# Patient Record
Sex: Male | Born: 1935 | Race: White | Hispanic: No | Smoking: Never smoker
Health system: Southern US, Community
[De-identification: ages and names within clinical notes are randomized; demographics above are authoritative.]

## PROBLEM LIST (undated history)

## (undated) DIAGNOSIS — Z8673 Personal history of transient ischemic attack (TIA), and cerebral infarction without residual deficits: Secondary | ICD-10-CM

## (undated) DIAGNOSIS — I4891 Unspecified atrial fibrillation: Secondary | ICD-10-CM

## (undated) DIAGNOSIS — I2510 Atherosclerotic heart disease of native coronary artery without angina pectoris: Secondary | ICD-10-CM

## (undated) DIAGNOSIS — I10 Essential (primary) hypertension: Secondary | ICD-10-CM

---

## 2013-01-09 ENCOUNTER — Encounter (HOSPITAL_BASED_OUTPATIENT_CLINIC_OR_DEPARTMENT_OTHER): Payer: Self-pay | Admitting: Emergency Medicine

## 2013-01-09 ENCOUNTER — Emergency Department (HOSPITAL_BASED_OUTPATIENT_CLINIC_OR_DEPARTMENT_OTHER): Payer: Medicare Other

## 2013-01-09 ENCOUNTER — Observation Stay (HOSPITAL_BASED_OUTPATIENT_CLINIC_OR_DEPARTMENT_OTHER)
Admission: EM | Admit: 2013-01-09 | Discharge: 2013-01-10 | Disposition: A | Discharge status: Home / Self Care | Payer: Medicare Other | Attending: Family Medicine | Admitting: Family Medicine

## 2013-01-09 DIAGNOSIS — R42 Dizziness and giddiness: Secondary | ICD-10-CM | POA: Insufficient documentation

## 2013-01-09 DIAGNOSIS — Z79899 Other long term (current) drug therapy: Secondary | ICD-10-CM | POA: Insufficient documentation

## 2013-01-09 DIAGNOSIS — Z7901 Long term (current) use of anticoagulants: Secondary | ICD-10-CM

## 2013-01-09 DIAGNOSIS — I4891 Unspecified atrial fibrillation: Secondary | ICD-10-CM

## 2013-01-09 DIAGNOSIS — H8143 Vertigo of central origin, bilateral: Secondary | ICD-10-CM

## 2013-01-09 DIAGNOSIS — I498 Other specified cardiac arrhythmias: Secondary | ICD-10-CM | POA: Insufficient documentation

## 2013-01-09 DIAGNOSIS — I493 Ventricular premature depolarization: Secondary | ICD-10-CM

## 2013-01-09 DIAGNOSIS — K148 Other diseases of tongue: Principal | ICD-10-CM | POA: Insufficient documentation

## 2013-01-09 DIAGNOSIS — I10 Essential (primary) hypertension: Secondary | ICD-10-CM

## 2013-01-09 DIAGNOSIS — R58 Hemorrhage, not elsewhere classified: Secondary | ICD-10-CM

## 2013-01-09 DIAGNOSIS — Z8673 Personal history of transient ischemic attack (TIA), and cerebral infarction without residual deficits: Secondary | ICD-10-CM

## 2013-01-09 DIAGNOSIS — D6832 Hemorrhagic disorder due to extrinsic circulating anticoagulants: Secondary | ICD-10-CM

## 2013-01-09 DIAGNOSIS — N179 Acute kidney failure, unspecified: Secondary | ICD-10-CM

## 2013-01-09 DIAGNOSIS — E860 Dehydration: Secondary | ICD-10-CM | POA: Diagnosis present

## 2013-01-09 DIAGNOSIS — K1379 Other lesions of oral mucosa: Secondary | ICD-10-CM | POA: Diagnosis present

## 2013-01-09 DIAGNOSIS — R001 Bradycardia, unspecified: Secondary | ICD-10-CM

## 2013-01-09 HISTORY — DX: Unspecified atrial fibrillation: I48.91

## 2013-01-09 HISTORY — DX: Atherosclerotic heart disease of native coronary artery without angina pectoris: I25.10

## 2013-01-09 HISTORY — DX: Personal history of transient ischemic attack (TIA), and cerebral infarction without residual deficits: Z86.73

## 2013-01-09 HISTORY — DX: Essential (primary) hypertension: I10

## 2013-01-09 LAB — COMPREHENSIVE METABOLIC PANEL
ALT: 18 U/L (ref 0–53)
Alkaline Phosphatase: 95 U/L (ref 39–117)
BUN: 39 mg/dL — ABNORMAL HIGH (ref 6–23)
CO2: 28 mEq/L (ref 19–32)
Creatinine, Ser: 1.5 mg/dL — ABNORMAL HIGH (ref 0.50–1.35)
GFR calc Af Amer: 47 mL/min — ABNORMAL LOW (ref >90)
GFR calc non Af Amer: 41 mL/min — ABNORMAL LOW (ref >90)
Glucose, Bld: 107 mg/dL — ABNORMAL HIGH (ref 70–99)
Potassium: 4.3 mEq/L (ref 3.5–5.1)
Sodium: 137 mEq/L (ref 135–145)
Total Protein: 6.9 g/dL (ref 6.0–8.3)

## 2013-01-09 LAB — DIFFERENTIAL
Basophils Relative: 0 % (ref 0–1)
Eosinophils Absolute: 0.1 10*3/uL (ref 0.0–0.7)
Eosinophils Relative: 1 % (ref 0–5)
Lymphocytes Relative: 10 % — ABNORMAL LOW (ref 12–46)
Lymphs Abs: 0.8 10*3/uL (ref 0.7–4.0)
Monocytes Absolute: 0.5 10*3/uL (ref 0.1–1.0)
Neutro Abs: 6.3 10*3/uL (ref 1.7–7.7)

## 2013-01-09 LAB — CBC
HCT: 42.1 % (ref 39.0–52.0)
Hemoglobin: 14 g/dL (ref 13.0–17.0)
MCH: 30.8 pg (ref 26.0–34.0)
MCHC: 33.3 g/dL (ref 30.0–36.0)
MCV: 92.7 fL (ref 78.0–100.0)
RDW: 14.2 % (ref 11.5–15.5)

## 2013-01-09 LAB — TROPONIN I: Troponin I: <0.3 ng/mL (ref <0.30)

## 2013-01-09 LAB — APTT: aPTT: 46 seconds — ABNORMAL HIGH (ref 24–37)

## 2013-01-09 MED ORDER — "THROMBI-PAD 3""X3"" EX PADS"
1.0000 | MEDICATED_PAD | Freq: Once | CUTANEOUS | Status: AC
  Administered 2013-01-09: 1 via TOPICAL

## 2013-01-09 MED ORDER — ATORVASTATIN CALCIUM 10 MG PO TABS
10.0000 mg | ORAL_TABLET | Freq: Every day | ORAL | Status: DC
  Administered 2013-01-09 – 2013-01-10 (×2): 10 mg via ORAL
  Filled 2013-01-09 (×2): qty 1

## 2013-01-09 MED ORDER — SODIUM CHLORIDE 0.9 % IV BOLUS (SEPSIS)
500.0000 mL | Freq: Once | INTRAVENOUS | Status: AC
  Administered 2013-01-09: 500 mL via INTRAVENOUS

## 2013-01-09 MED ORDER — LIDOCAINE-EPINEPHRINE 2 %-1:100000 IJ SOLN: 1.7000 mL | Freq: Once | INTRAMUSCULAR | Status: DC

## 2013-01-09 MED ORDER — ONDANSETRON HCL 4 MG/2ML IJ SOLN: 4.0000 mg | Freq: Four times a day (QID) | INTRAMUSCULAR | Status: DC | PRN

## 2013-01-09 MED ORDER — "THROMBI-PAD 3""X3"" EX PADS"
MEDICATED_PAD | CUTANEOUS | Status: AC
  Filled 2013-01-09: qty 1

## 2013-01-09 MED ORDER — SODIUM CHLORIDE 0.9 % IJ SOLN
3.0000 mL | Freq: Two times a day (BID) | INTRAMUSCULAR | Status: DC
  Administered 2013-01-09: 3 mL via INTRAVENOUS

## 2013-01-09 MED ORDER — SODIUM CHLORIDE 0.9 % IV SOLN
INTRAVENOUS | Status: DC
  Administered 2013-01-09: 11:00:00 via INTRAVENOUS

## 2013-01-09 MED ORDER — FUROSEMIDE 20 MG PO TABS
20.0000 mg | ORAL_TABLET | Freq: Every day | ORAL | Status: DC
  Administered 2013-01-09 – 2013-01-10 (×2): 20 mg via ORAL
  Filled 2013-01-09 (×2): qty 1

## 2013-01-09 MED ORDER — IRBESARTAN 300 MG PO TABS
300.0000 mg | ORAL_TABLET | Freq: Every day | ORAL | Status: DC
  Administered 2013-01-09 – 2013-01-10 (×2): 300 mg via ORAL
  Filled 2013-01-09 (×2): qty 1

## 2013-01-09 MED ORDER — MORPHINE SULFATE 2 MG/ML IJ SOLN: 1.0000 mg | INTRAMUSCULAR | Status: DC | PRN

## 2013-01-09 MED ORDER — ACETAMINOPHEN 650 MG RE SUPP: 650.0000 mg | Freq: Four times a day (QID) | RECTAL | Status: DC | PRN

## 2013-01-09 MED ORDER — LIDOCAINE-EPINEPHRINE 2 %-1:100000 IJ SOLN
INTRAMUSCULAR | Status: AC
  Filled 2013-01-09: qty 1

## 2013-01-09 MED ORDER — "THROMBI-PAD 3""X3"" EX PADS"
MEDICATED_PAD | CUTANEOUS | Status: AC
  Administered 2013-01-09: 1 via TOPICAL
  Filled 2013-01-09: qty 1

## 2013-01-09 MED ORDER — GELATIN ABSORBABLE 12-7 MM EX MISC
CUTANEOUS | Status: AC
  Filled 2013-01-09: qty 1

## 2013-01-09 MED ORDER — "THROMBI-PAD 3""X3"" EX PADS"
1.0000 | MEDICATED_PAD | Freq: Once | CUTANEOUS | Status: AC
  Administered 2013-01-09: 1 via TOPICAL
  Filled 2013-01-09: qty 1

## 2013-01-09 MED ORDER — ACETAMINOPHEN 325 MG PO TABS
650.0000 mg | ORAL_TABLET | Freq: Four times a day (QID) | ORAL | Status: DC | PRN
  Administered 2013-01-10: 650 mg via ORAL
  Filled 2013-01-09 (×2): qty 2

## 2013-01-09 MED ORDER — ONDANSETRON HCL 4 MG PO TABS: 4.0000 mg | ORAL_TABLET | Freq: Four times a day (QID) | ORAL | Status: DC | PRN

## 2013-01-09 NOTE — ED Provider Notes (Signed)
CSN: [id number]     Arrival date & time 01/09/13  4098 History   First MD Initiated Contact with Patient 01/09/13 [phone]     Chief Complaint  Patient presents with  . Dizziness   (Consider location/radiation/quality/duration/timing/severity/associated sxs/prior Treatment) The history is provided by the patient.  Patient states he has mild dizziness, with spinning sensation without tinnitus but states he is here for a blood transfusion related to the fact that his tongue has been bleeding for 3 days on coumadin and [place] sent him here for that.  He thinks the spinning is related to blood loss.  States he walks with a cane due to previous stroke and denies speech, changes weakness or numbness.  EDP asked about facial droop but he states that is related to the dried blood in the corner of his mouth  Past Medical History  Diagnosis Date  . Hypertension   . Coronary artery disease   . Stroke   . A-fib    History reviewed. No pertinent past surgical history. History reviewed. No pertinent family history. History  Substance Use Topics  . Smoking status: Never Smoker   . Smokeless tobacco: Not on file  . Alcohol Use: No    Review of Systems  Cardiovascular: Negative for chest pain.  Neurological: Positive for dizziness. Negative for syncope, speech difficulty, weakness, numbness and headaches.  All other systems reviewed and are negative.    Allergies  Review of patient's allergies indicates no known allergies.  Home Medications   Current Outpatient Rx  Name  Route  Sig  Dispense  Refill  . atorvastatin (LIPITOR) 10 MG tablet   Oral   Take 10 mg by mouth daily.         . bisoprolol (ZEBETA) 5 MG tablet   Oral   Take 5 mg by mouth daily.         . digoxin (LANOXIN) 0.25 MG tablet   Oral   Take 0.25 mg by mouth daily.         . furosemide (LASIX) 20 MG tablet   Oral   Take 20 mg by mouth.         . valsartan (DIOVAN) 320 MG tablet   Oral   Take 320 mg by  mouth daily.         Marland Kitchen warfarin (COUMADIN) 5 MG tablet   Oral   Take 5 mg by mouth daily.          BP 154/67  Pulse 61  Temp(Src) 98.6 F (37 C) (Oral)  Resp 18  Ht 5\' 8"  (1.727 m)  Wt 200 lb (90.719 kg)  BMI 30.42 kg/m2  SpO2 100% Physical Exam  Constitutional: He is oriented to person, place, and time. He appears well-developed and well-nourished.  HENT:  Head: Normocephalic and atraumatic.  Mouth/Throat: No oropharyngeal exudate.  Flap of tongue with necrotic edge oozing on exam  Eyes: Conjunctivae and EOM are normal. Pupils are equal, round, and reactive to light.  Neck: Normal range of motion. Neck supple.  Cardiovascular: An irregularly irregular rhythm present. Bradycardia present.   Pulmonary/Chest: Effort normal and breath sounds normal. He has no wheezes. He has no rales.  Abdominal: Soft. Bowel sounds are normal. There is no tenderness. There is no rebound and no guarding.  Musculoskeletal: Normal range of motion.  Neurological: He is alert and oriented to person, place, and time. He has normal reflexes. He displays normal reflexes. He exhibits normal muscle tone.  Facial droop noted  Skin: Skin is warm and dry. He is not diaphoretic.  Psychiatric: He has a normal mood and affect.    ED Course  Procedures (including critical care time) Labs Review Labs Reviewed  PROTIME-INR - Abnormal; Notable for the following:    Prothrombin Time 24.7 (*)    INR 2.32 (*)    All other components within normal limits  APTT - Abnormal; Notable for the following:    aPTT 46 (*)    All other components within normal limits  DIFFERENTIAL - Abnormal; Notable for the following:    Neutrophils Relative % 82 (*)    Lymphocytes Relative 10 (*)    All other components within normal limits  COMPREHENSIVE METABOLIC PANEL - Abnormal; Notable for the following:    Glucose, Bld 107 (*)    BUN 39 (*)    Creatinine, Ser 1.50 (*)    Total Bilirubin 1.3 (*)    GFR calc non Af Amer 41  (*)    GFR calc Af Amer 47 (*)    All other components within normal limits  CBC  TROPONIN I   Imaging Review Ct Head (brain) Wo Contrast  01/09/2013   CLINICAL DATA:  Dizziness, hypertension.  EXAM: CT HEAD WITHOUT CONTRAST  TECHNIQUE: Contiguous axial images were obtained from the base of the skull through the vertex without intravenous contrast.  COMPARISON:  None.  FINDINGS: Atherosclerotic and physiologic intracranial calcifications. Lacunar infarcts in the right caudate nucleus and left external capsule. Diffuse parenchymal atrophy. Patchy areas of hypoattenuation in deep and periventricular white matter bilaterally. Negative for acute intracranial hemorrhage, mass lesion, acute infarction, midline shift, or mass-effect. Acute infarct may be in apparent on noncontrast CT. Ventricles and sulci symmetric. Bone windows demonstrate no focal lesion.  IMPRESSION:  Negative for bleed or other acute intracranial process.  Atrophy and nonspecific white matter changes   Electronically Signed   By: Oley Balm M.D.   On: 01/09/2013 04:55    MDM   1. Vertigo, central origin, bilateral   2. Bleeding     Date: 01/09/2013  Rate:54   Rhythm: atrial fibrillation  QRS Axis: normal  Intervals: normal  ST/T Wave abnormalities: normal  Conduction Disutrbances:none  Narrative Interpretation:   Old EKG Reviewed: none available  Tongue now hemostatic following tannic acid and procoagulant foam as directed by dental kit.   Will need admission for central vertigo work up and repeat [facility]     Marti Mclane Smitty Cords, MD 01/09/13 [phone]

## 2013-01-09 NOTE — Progress Notes (Signed)
Received report from [place] @ med [facility]. Pt currently still has bleeding from biting his tongue x3 days ago. Have ordered another thrombi pad to place on tongue. Pt also currently c/o dizziness but states "it is no different than the dizziness that I usually have." Is neuro intact with no deficits. Have paged admitting MD and placed patient on tele. Tele is running rate controlled A. Fib which is chronic and he takes coumadin for. Will continue to monitor  Dorcus Riga, [place], RN 01/09/2013 9:44 AM

## 2013-01-09 NOTE — ED Notes (Signed)
Pt reports that he bit his tongue three days ago, tongue has been bleeding for 3 days, tonight he became dizzy

## 2013-01-09 NOTE — Progress Notes (Signed)
Completed stroke swallow screen for precautions. Pt had no difficulty swallowing. Able to cough on command. Continues to have noticeable bleeding from tongue. Per patient, MD told him he could have soft foods so gave him apple sauce and ice chips for now. Will continue to monitor. Mikaya Bunner, [place] 11:56 AM

## 2013-01-09 NOTE — ED Notes (Signed)
Replaced tea bag on pts tongue, tongue bleed looks to be improving

## 2013-01-09 NOTE — H&P (Signed)
[facility] History and Physical  John Fritz:[id number] DOB: 27-Aug-1925 DOA: 01/09/2013  Referring physician: April Smitty Cords, MD PCP: No primary provider on file.  Specialists:  Chief Complaint: Tongue and bleeding and dizziness  HPI: John Fritz is a 79 y.o. male with past medical history of hypertension, atrial fibrillation a stroke about 15-20 years ago. Patient is on Coumadin he came to the emergency department complaining about is coming been bleeding for the past 2 days. Patient was in his usual state of health till he bit his tongue about 2 days ago and started to bleed from his tongue, patient lives in [place] and he's been observed over there, but the bleeding did not stop so he was transferred yesterday to [facility] med center, he he also mentioned some dizziness so patient transferred to [facility] for further evaluation.  Review of Systems: Constitutional: negative for anorexia, fevers and sweats Eyes: negative for irritation, redness and visual disturbance Ears, nose, mouth, throat, and face: negative for earaches, epistaxis, nasal congestion and sore throat Respiratory: negative for cough, dyspnea on exertion, sputum and wheezing Cardiovascular: negative for chest pain, dyspnea, lower extremity edema, orthopnea, palpitations and syncope Gastrointestinal: negative for abdominal pain, constipation, diarrhea, melena, nausea and vomiting Genitourinary:negative for dysuria, frequency and hematuria Hematologic/lymphatic: negative for bleeding, easy bruising and lymphadenopathy Musculoskeletal:negative for arthralgias, muscle weakness and stiff joints Neurological: negative for coordination problems, gait problems, headaches and weakness Endocrine: negative for diabetic symptoms including polydipsia, polyuria and weight loss Allergic/Immunologic: negative for anaphylaxis, hay fever and urticaria  Past Medical History  Diagnosis Date  .  Hypertension   . Coronary artery disease   . Stroke   . A-fib    History reviewed. No pertinent past surgical history. Social History:  reports that he has never smoked. He does not have any smokeless tobacco history on file. He reports that he does not drink alcohol. His drug history is not on file.  No Known Allergies  History reviewed. No pertinent family history.  Prior to Admission medications   Medication Sig Start Date End Date Taking? Authorizing Provider  atorvastatin (LIPITOR) 10 MG tablet Take 10 mg by mouth daily.   Yes Historical Provider, MD  bisoprolol (ZEBETA) 5 MG tablet Take 5 mg by mouth daily.   Yes Historical Provider, MD  digoxin (LANOXIN) 0.25 MG tablet Take 0.25 mg by mouth daily.   Yes Historical Provider, MD  furosemide (LASIX) 20 MG tablet Take 20 mg by mouth.   Yes Historical Provider, MD  valsartan (DIOVAN) 320 MG tablet Take 320 mg by mouth daily.   Yes Historical Provider, MD  warfarin (COUMADIN) 5 MG tablet Take 5 mg by mouth daily.   Yes Historical Provider, MD   Physical Exam: Filed Vitals:   01/09/13 1357  BP: 155/65  Pulse: 61  Temp: 99.1 F (37.3 C)  Resp: 18   General appearance: alert, cooperative and no distress  Head: Normocephalic, without obvious abnormality, atraumatic  Eyes: conjunctivae/corneas clear. PERRL, EOM's intact. Fundi benign.  Nose: Nares normal. Septum midline. Mucosa normal. No drainage or sinus tenderness.  Throat: lips, mucosa, and tongue normal; teeth and gums normal  Neck: Supple, no masses, no cervical lymphadenopathy, no JVD appreciated, no meningeal signs Resp: clear to auscultation bilaterally  Chest wall: no tenderness  Cardio: regular rate and rhythm, S1, S2 normal, no murmur, click, rub or gallop  GI: soft, non-tender; bowel sounds normal; no masses, no organomegaly  Extremities: extremities normal, atraumatic, no cyanosis  or edema  Skin: Skin color, texture, turgor normal. No rashes or lesions  Neurologic:  Alert and oriented X 3, normal strength and tone. Normal symmetric reflexes. Normal coordination and gait Tongue: Bleeding, but can't on the upper surface of the tongue oozing blood. Please see the photo below.      Labs on Admission:  Basic Metabolic Panel:  Recent Labs Lab 01/09/13 0352  NA 137  K 4.3  CL 97  CO2 28  GLUCOSE 107*  BUN 39*  CREATININE 1.50*  CALCIUM 10.4   Liver Function Tests:  Recent Labs Lab 01/09/13 0352  AST 23  ALT 18  ALKPHOS 95  BILITOT 1.3*  PROT 6.9  ALBUMIN 3.5   No results found for this basename: LIPASE, AMYLASE,  in the last 168 hours No results found for this basename: AMMONIA,  in the last 168 hours CBC:  Recent Labs Lab 01/09/13 0352  WBC 7.7  NEUTROABS 6.3  HGB 14.0  HCT 42.1  MCV 92.7  PLT 208   Cardiac Enzymes:  Recent Labs Lab 01/09/13 0352  TROPONINI <0.30    BNP (last 3 results) No results found for this basename: PROBNP,  in the last 8760 hours CBG: No results found for this basename: GLUCAP,  in the last 168 hours  Radiological Exams on Admission: Ct Head (brain) Wo Contrast  01/09/2013   CLINICAL DATA:  Dizziness, hypertension.  EXAM: CT HEAD WITHOUT CONTRAST  TECHNIQUE: Contiguous axial images were obtained from the base of the skull through the vertex without intravenous contrast.  COMPARISON:  None.  FINDINGS: Atherosclerotic and physiologic intracranial calcifications. Lacunar infarcts in the right caudate nucleus and left external capsule. Diffuse parenchymal atrophy. Patchy areas of hypoattenuation in deep and periventricular white matter bilaterally. Negative for acute intracranial hemorrhage, mass lesion, acute infarction, midline shift, or mass-effect. Acute infarct may be in apparent on noncontrast CT. Ventricles and sulci symmetric. Bone windows demonstrate no focal lesion.  IMPRESSION:  Negative for bleed or other acute intracranial process.  Atrophy and nonspecific white matter changes    Electronically Signed   By: Oley Balm M.D.   On: 01/09/2013 04:55    EKG: Independently reviewed.   Assessment/Plan Principal Problem:   Mouth bleeding Active Problems:   A-fib   H/O: CVA (cerebrovascular accident)   Hypertension   Tongue bleeding -Secondary to tongue biting the patient being on Coumadin. No respiratory compromise. -His INR is 2.3, to give one unit of FFP. -Press the tongue try to achieve home he stays is, if continued stable it she might need oral surgery to evaluate her.  Dizziness -Patient reports that his dizziness is not more than what he usually have, no focal or lateralization symptoms. -CT scan of the head showed no acute abnormalities. Patient is on Coumadin, I will not order MRI.  Atrial fibrillation -With slow ventricular response, patient is on bisoprolol and digoxin, both held. -Patient heart rate went down to 41, patient has no symptoms. -Check digoxin level, hold both digoxin and bisoprolol.  History of CVA -Affected his left side, virtually no residual neurological effect. -We will hold Coumadin and give FFP because of tongue bleeding.  Code Status: Full code Family Communication: Plan discussed with the patient Disposition Plan: Observation  Time spent: 70 minutes  [facility] A [facility] Pager [phone]  If 7PM-7AM, please contact night-coverage [email or web address] Password [email or web address] 01/09/2013, 2:12 PM

## 2013-01-09 NOTE — Plan of Care (Signed)
79 yo M on coumadin, loss of ballence, bit tongue 2 days ago, been bleeding ever since, not anemic at all.  Reason for admission: h/o CVA, has new R sided facial droop (new from baseline), admitted for MRI to r/o new CVA, also repeat H/H.

## 2013-01-09 NOTE — Progress Notes (Signed)
Received calls from cardiac monitoring dept about patients rate. Patient is running a.fib with SVR;  HR varies from 41-62. Patient has not received any medications today to contribute to slow rate. Denies symptoms other than dizziness when ambulating, which "he always has." Notified MD.

## 2013-01-10 DIAGNOSIS — R001 Bradycardia, unspecified: Secondary | ICD-10-CM | POA: Diagnosis present

## 2013-01-10 DIAGNOSIS — D6832 Hemorrhagic disorder due to extrinsic circulating anticoagulants: Secondary | ICD-10-CM

## 2013-01-10 DIAGNOSIS — I4949 Other premature depolarization: Secondary | ICD-10-CM

## 2013-01-10 DIAGNOSIS — E860 Dehydration: Secondary | ICD-10-CM | POA: Diagnosis present

## 2013-01-10 DIAGNOSIS — T458X1A Poisoning by other primarily systemic and hematological agents, accidental (unintentional), initial encounter: Secondary | ICD-10-CM

## 2013-01-10 DIAGNOSIS — T45511A Poisoning by anticoagulants, accidental (unintentional), initial encounter: Secondary | ICD-10-CM

## 2013-01-10 DIAGNOSIS — N179 Acute kidney failure, unspecified: Secondary | ICD-10-CM | POA: Diagnosis present

## 2013-01-10 DIAGNOSIS — Z7901 Long term (current) use of anticoagulants: Secondary | ICD-10-CM

## 2013-01-10 DIAGNOSIS — I493 Ventricular premature depolarization: Secondary | ICD-10-CM | POA: Diagnosis present

## 2013-01-10 DIAGNOSIS — K1370 Unspecified lesions of oral mucosa: Secondary | ICD-10-CM

## 2013-01-10 LAB — CBC
HCT: 32.8 % — ABNORMAL LOW (ref 39.0–52.0)
Hemoglobin: 11.1 g/dL — ABNORMAL LOW (ref 13.0–17.0)
MCH: 30.8 pg (ref 26.0–34.0)
MCV: 91.1 fL (ref 78.0–100.0)
Platelets: 153 10*3/uL (ref 150–400)
RBC: 3.6 MIL/uL — ABNORMAL LOW (ref 4.22–5.81)
WBC: 5 10*3/uL (ref 4.0–10.5)

## 2013-01-10 LAB — BASIC METABOLIC PANEL
BUN: 41 mg/dL — ABNORMAL HIGH (ref 6–23)
CO2: 26 mEq/L (ref 19–32)
GFR calc Af Amer: 54 mL/min — ABNORMAL LOW (ref >90)
Glucose, Bld: 89 mg/dL (ref 70–99)
Potassium: 3.5 mEq/L (ref 3.5–5.1)
Sodium: 137 mEq/L (ref 135–145)

## 2013-01-10 LAB — PROTIME-INR: INR: 1.8 — ABNORMAL HIGH (ref 0.00–1.49)

## 2013-01-10 MED ORDER — WARFARIN SODIUM 5 MG PO TABS: 5.0000 mg | ORAL_TABLET | Freq: Every day | ORAL | Status: AC

## 2013-01-10 MED ORDER — BISOPROLOL FUMARATE 5 MG PO TABS: 2.5000 mg | ORAL_TABLET | Freq: Every day | ORAL | Status: AC

## 2013-01-10 NOTE — Progress Notes (Signed)
Utilization Review completed.  

## 2013-01-10 NOTE — Discharge Summary (Signed)
Physician Discharge Summary  John Fritz NUU:[id number] DOB: 1925-11-21 DOA: 01/09/2013  PCP: Jari Favre, MD  Admit date: 01/09/2013 Discharge date: 01/10/2013  Recommendations for Outpatient Follow-up:  1. Check CBC, PT/INR, heart rate, BP on follow up  2. Schedule follow up with cardiology to review cardiac meds, PVCs, bradycardia 3. Reassess wound to be sure no recurrent bleeding 4. Digoxin was stopped because of bradycardia (HR was 41 and pt has been having dizziness)   Discharge Diagnoses:     Mouth bleeding - resolved   A-fib   H/O: CVA (cerebrovascular accident)   Hypertension   Asymptomatic PVCs   AKI (acute kidney injury) - resolved   Bradycardia   Long term (current) use of anticoagulants  Discharge Condition: stable   Diet recommendation: heart healthy  Filed Weights   01/09/13 0324 01/09/13 0852  Weight: 90.719 kg (200 lb) 90.9 kg (200 lb 6.4 oz)    History of present illness:  John Fritz is a 79 y.o. male with past medical history of hypertension, atrial fibrillation a stroke about 15-20 years ago. Patient is on Coumadin he came to the emergency department complaining about is coming been bleeding for the past 2 days. Patient was in his usual state of health till he bit his tongue about 2 days ago and started to bleed from his tongue, patient lives in [place] and he's been observed over there, but the bleeding did not stop so he was transferred yesterday to [facility] med center, he he also mentioned some dizziness so patient transferred to [facility] for further evaluation  Hospital Course:  Tongue bleeding  -Secondary to tongue biting the patient being on Coumadin. No respiratory compromise.  - resolved now. No more bleeding.  -His INR is 2.3, now down to 1.8 after one unit of FFP.    Acute Renal Insufficiency -Resolved after IVFs.   Bradycardia - held bisoprolol and digoxin. -dig level was 0.8. - resuming half dose of bisoprolol at  discharge - discontinue digoxin -close follow up with PCP recommended and also recommended pt follow up with a cardiologist outpatient  PVCs - asymptomatic - resuming low dose bisoprolol -close outpatient followup with PCP and cardiology recommended  Dizziness  -Patient reports that his dizziness is not more than what he usually have, no focal or lateralization symptoms.  -CT scan of the head showed no acute abnormalities. Patient is on Coumadin -likely this was related to dehydration, AKI and bradycardia  Atrial fibrillation  -With slow ventricular response, patient is on bisoprolol and digoxin, both held.  -Patient heart rate went down to 41 on admission patient has no symptoms.  -digoxin level was 0.8.  -pt had some asymptomatic runs of PVCs - restarting low dose bisoprolol 2.5 mg daily. - DC digoxin -close follow up with PCP and cardiology recommended. The patient verbalized understanding.  - pt to restart warfarin on 9/22 and follow up with PCP for PT/INR in 2-3 days  History of CVA  -Affected his left side, virtually no residual neurological effect.  -We held Coumadin and gave 1 unit of FFP because of active tongue bleeding.  Code Status: Full code  Family Communication: Plan discussed with the patient  Disposition Plan: DC   Discharge Exam: Pt reports that he feels much better.  Dizziness has resolved.  HR in the 50-60s.  Bleeding from tongue has resolved and has not restarted.  Hemodynamics have been stable.  Filed Vitals:   01/10/13 0600  BP: 149/52  Pulse: 52  Temp: 97.9 F (36.6 C)  Resp: 18   General: awake, alert, no distress, cooperative HEENT: tongue lesion healing, no signs of active bleeding Cardiovascular: irreg irregular Respiratory: BBS clear  Discharge Instructions  Discharge Orders   Future Orders Complete By Expires   Diet - low sodium heart healthy  As directed    Discharge instructions  As directed    Comments:     See your primary care  physician in 3 days to check CBC, pulse rate, INR See a cardiologist in 2 weeks to review cardiac meds and evaluate low heart rate Stop digoxin Take only half of the former bisoprolol dose 2.5 mg total  You can restart taking warfarin tomorrow 9/22.  Return if symptoms recur, worsen or new problems develop.   Discontinue IV  As directed    Increase activity slowly  As directed        Medication List    STOP taking these medications       digoxin 0.25 MG tablet  Commonly known as:  LANOXIN      TAKE these medications       atorvastatin 10 MG tablet  Commonly known as:  LIPITOR  Take 10 mg by mouth daily.     bisoprolol 5 MG tablet  Commonly known as:  ZEBETA  Take 0.5 tablets (2.5 mg total) by mouth daily.     furosemide 20 MG tablet  Commonly known as:  LASIX  Take 20 mg by mouth.     valsartan 320 MG tablet  Commonly known as:  DIOVAN  Take 320 mg by mouth daily.     warfarin 5 MG tablet  Commonly known as:  COUMADIN  Take 1 tablet (5 mg total) by mouth daily.  Start taking on:  01/11/2013       No Known Allergies     Follow-up Information   Follow up with SMITH,LYLE, MD. Schedule an appointment as soon as possible for a visit in 3 days. (check CBC, PT INR,)    Specialty:  Internal Medicine   Contact information:   [address] [place] [address] [phone]       Follow up with See a cardiologist. Schedule an appointment as soon as possible for a visit in 2 weeks.     The results of significant diagnostics from this hospitalization (including imaging, microbiology, ancillary and laboratory) are listed below for reference.    Significant Diagnostic Studies: Ct Head (brain) Wo Contrast  01/09/2013   CLINICAL DATA:  Dizziness, hypertension.  EXAM: CT HEAD WITHOUT CONTRAST  TECHNIQUE: Contiguous axial images were obtained from the base of the skull through the vertex without intravenous contrast.  COMPARISON:  None.  FINDINGS: Atherosclerotic  and physiologic intracranial calcifications. Lacunar infarcts in the right caudate nucleus and left external capsule. Diffuse parenchymal atrophy. Patchy areas of hypoattenuation in deep and periventricular white matter bilaterally. Negative for acute intracranial hemorrhage, mass lesion, acute infarction, midline shift, or mass-effect. Acute infarct may be in apparent on noncontrast CT. Ventricles and sulci symmetric. Bone windows demonstrate no focal lesion.  IMPRESSION:  Negative for bleed or other acute intracranial process.  Atrophy and nonspecific white matter changes   Electronically Signed   By: Oley Balm M.D.   On: 01/09/2013 04:55   Microbiology: No results found for this or any previous visit (from the past 240 hour(s)).   Labs: Basic Metabolic Panel:  Recent Labs Lab 01/09/13 0352 01/09/13 2359 01/10/13 0555  NA 137  --  137  K 4.3  --  3.5  CL 97  --  101  CO2 28  --  26  GLUCOSE 107*  --  89  BUN 39*  --  41*  CREATININE 1.50*  --  1.35  CALCIUM 10.4  --  9.3  MG  --  2.1  --    Liver Function Tests:  Recent Labs Lab 01/09/13 0352  AST 23  ALT 18  ALKPHOS 95  BILITOT 1.3*  PROT 6.9  ALBUMIN 3.5   No results found for this basename: LIPASE, AMYLASE,  in the last 168 hours No results found for this basename: AMMONIA,  in the last 168 hours CBC:  Recent Labs Lab 01/09/13 0352 01/10/13 0555  WBC 7.7 5.0  NEUTROABS 6.3  --   HGB 14.0 11.1*  HCT 42.1 32.8*  MCV 92.7 91.1  PLT 208 153   Cardiac Enzymes:  Recent Labs Lab 01/09/13 0352  TROPONINI <0.30   BNP: BNP (last 3 results) No results found for this basename: PROBNP,  in the last 8760 hours CBG: No results found for this basename: GLUCAP,  in the last 168 hours  Signed:  Doree Kuehne  [facility] 01/10/2013, 9:30 AM

## 2013-01-10 NOTE — Progress Notes (Signed)
Discharge orders received. Gave instructions to follow up with PCP for lab draws/ HR check and for any medication adjustments. Reviewed medication changes and to stop the digoxin for now. Advised him to seek care if he develops any more abnormal bleeding. He verbalized understanding, discharged back to [place] via [place]/ [place] service driver with all belongings.=.

## 2013-01-10 NOTE — Progress Notes (Signed)
Tongue / upper palate no longer bleeding. Patient has no complaints; is not dizzy. Still running a.fib (chronic) HR 52-66 bpm. Is anxious to get back to his independent living at [facility].

## 2013-01-11 LAB — PREPARE FRESH FROZEN PLASMA: Unit division: 0

## 2013-06-22 ENCOUNTER — Emergency Department (HOSPITAL_BASED_OUTPATIENT_CLINIC_OR_DEPARTMENT_OTHER)
Admission: EM | Admit: 2013-06-22 | Discharge: 2013-06-22 | Disposition: A | Discharge status: Other Acute Inpatient Hospital | Payer: Medicare Other | Attending: Emergency Medicine | Admitting: Emergency Medicine

## 2013-06-22 ENCOUNTER — Emergency Department (HOSPITAL_BASED_OUTPATIENT_CLINIC_OR_DEPARTMENT_OTHER): Payer: Medicare Other

## 2013-06-22 ENCOUNTER — Encounter (HOSPITAL_BASED_OUTPATIENT_CLINIC_OR_DEPARTMENT_OTHER): Payer: Self-pay | Admitting: Emergency Medicine

## 2013-06-22 DIAGNOSIS — I10 Essential (primary) hypertension: Secondary | ICD-10-CM | POA: Insufficient documentation

## 2013-06-22 DIAGNOSIS — Z79899 Other long term (current) drug therapy: Secondary | ICD-10-CM | POA: Insufficient documentation

## 2013-06-22 DIAGNOSIS — C9590 Leukemia, unspecified not having achieved remission: Secondary | ICD-10-CM | POA: Insufficient documentation

## 2013-06-22 DIAGNOSIS — R630 Anorexia: Secondary | ICD-10-CM | POA: Insufficient documentation

## 2013-06-22 DIAGNOSIS — I2510 Atherosclerotic heart disease of native coronary artery without angina pectoris: Secondary | ICD-10-CM | POA: Insufficient documentation

## 2013-06-22 DIAGNOSIS — Z7901 Long term (current) use of anticoagulants: Secondary | ICD-10-CM | POA: Insufficient documentation

## 2013-06-22 DIAGNOSIS — Z8673 Personal history of transient ischemic attack (TIA), and cerebral infarction without residual deficits: Secondary | ICD-10-CM | POA: Insufficient documentation

## 2013-06-22 DIAGNOSIS — I4891 Unspecified atrial fibrillation: Secondary | ICD-10-CM | POA: Insufficient documentation

## 2013-06-22 DIAGNOSIS — R29810 Facial weakness: Secondary | ICD-10-CM | POA: Insufficient documentation

## 2013-06-22 LAB — COMPREHENSIVE METABOLIC PANEL
ALK PHOS: 160 U/L — AB (ref 39–117)
ALT: 40 U/L (ref 0–53)
AST: 56 U/L — AB (ref 0–37)
Albumin: 3 g/dL — ABNORMAL LOW (ref 3.5–5.2)
BUN: 34 mg/dL — ABNORMAL HIGH (ref 6–23)
CO2: 23 meq/L (ref 19–32)
Calcium: 9 mg/dL (ref 8.4–10.5)
Chloride: 100 mEq/L (ref 96–112)
Creatinine, Ser: 1.7 mg/dL — ABNORMAL HIGH (ref 0.50–1.35)
GFR calc Af Amer: 41 mL/min — ABNORMAL LOW (ref >90)
GFR calc non Af Amer: 35 mL/min — ABNORMAL LOW (ref >90)
GLUCOSE: 111 mg/dL — AB (ref 70–99)
POTASSIUM: 3.5 meq/L — AB (ref 3.7–5.3)
SODIUM: 137 meq/L (ref 137–147)
TOTAL PROTEIN: 6.7 g/dL (ref 6.0–8.3)
Total Bilirubin: 1.2 mg/dL (ref 0.3–1.2)

## 2013-06-22 LAB — CBC WITH DIFFERENTIAL/PLATELET
BASOS ABS: 0 10*3/uL (ref 0.0–0.1)
Basophils Relative: 0 % (ref 0–1)
Eosinophils Absolute: 0 10*3/uL (ref 0.0–0.7)
Eosinophils Relative: 0 % (ref 0–5)
HEMATOCRIT: 27.2 % — AB (ref 39.0–52.0)
Hemoglobin: 9 g/dL — ABNORMAL LOW (ref 13.0–17.0)
LYMPHS PCT: 0 % — AB (ref 12–46)
Lymphs Abs: 0 10*3/uL — ABNORMAL LOW (ref 0.7–4.0)
MCH: 30.9 pg (ref 26.0–34.0)
MCHC: 33.1 g/dL (ref 30.0–36.0)
MCV: 93.5 fL (ref 78.0–100.0)
MONOS PCT: 0 % — AB (ref 3–12)
NEUTROS ABS: 4.1 10*3/uL (ref 1.7–7.7)
NEUTROS PCT: 2 % — AB (ref 43–77)
Other: 98 %
Platelets: 38 10*3/uL — ABNORMAL LOW (ref 150–400)
RBC: 2.91 MIL/uL — AB (ref 4.22–5.81)
RDW: 21 % — AB (ref 11.5–15.5)
WBC: 206.1 10*3/uL — AB (ref 4.0–10.5)

## 2013-06-22 LAB — PROTIME-INR
INR: 3.37 — AB (ref 0.00–1.49)
Prothrombin Time: 32.9 seconds — ABNORMAL HIGH (ref 11.6–15.2)

## 2013-06-22 LAB — I-STAT CG4 LACTIC ACID, ED: Lactic Acid, Venous: 0.58 mmol/L (ref 0.5–2.2)

## 2013-06-22 LAB — TROPONIN I: Troponin I: <0.3 ng/mL (ref <0.30)

## 2013-06-22 MED ORDER — SODIUM CHLORIDE 0.9 % IV BOLUS (SEPSIS): 1000.0000 mL | Freq: Once | INTRAVENOUS | Status: DC

## 2013-06-22 MED ORDER — SODIUM CHLORIDE 0.9 % IV BOLUS (SEPSIS)
500.0000 mL | Freq: Once | INTRAVENOUS | Status: AC
  Administered 2013-06-22: 500 mL via INTRAVENOUS

## 2013-06-22 NOTE — ED Notes (Signed)
Leigh Dohner [place]- [phone]

## 2013-06-22 NOTE — ED Notes (Signed)
Pt reports was in clinic to have ear wax removed.  After removal, pt reports was lightheaded and dizzy.  BP was in the 33X systolic.  Sent here for eval.

## 2013-06-22 NOTE — ED Notes (Signed)
Redraw for verification of blood.

## 2013-06-22 NOTE — ED Notes (Signed)
CRITICAL VALUE: WBC 206.1.  MD NOTIFIED

## 2013-06-22 NOTE — ED Notes (Signed)
Pt complains of dizziness for a couple days.  Denies chest pain, shortness of breath, blood in stool, n/v.  Pt reports feeling lightheaded.  Then today had ear wax removed and nurse couldn't get his pressure above 90 systolic.  Pt continues to request IV fluids.  Denies any pain anywhere.

## 2013-06-22 NOTE — ED Provider Notes (Addendum)
CSN: [id number]     Arrival date & time 06/22/13  1546 History   First MD Initiated Contact with Patient 06/22/13 1556     Chief Complaint  Patient presents with  . Dizziness     (Consider location/radiation/quality/duration/timing/severity/associated sxs/prior Treatment) HPI Comments: Patient with a history of coronary artery disease, CVA, hypertension and A. fib on Coumadin presents with lightheadedness. He states she's felt a little better for the last 2 or 3 days. He states that when he stands up or sits up too fast he gets dizzy and lightheaded. He denies any spinning sensation. He denies any headaches. He does state that he hasn't been drinking too much over the last few days and he feels like he is dehydrated. He describes that he had some flulike symptoms in December and it took a long time to get over that. He's had a little bit of a decreased appetite over the last several weeks. He had had some discomfort in his lower abdomen but denies any current abdominal discomfort. He states she's having normal bowel movements. He denies any blood in his stool or melena. He denies any nausea or vomiting. He denies any cough or cold symptoms. He denies any chest tightness or shortness of breath. He denies any fevers or chills. He denies any unilateral numbness or weakness. He denies any speech deficits or vision changes.  Patient is a 80 y.o. male presenting with dizziness.  Dizziness Associated symptoms: no blood in stool, no chest pain, no diarrhea, no headaches, no nausea, no shortness of breath and no vomiting     Past Medical History  Diagnosis Date  . Hypertension   . Coronary artery disease   . H/O: CVA (cerebrovascular accident)   . A-fib    History reviewed. No pertinent past surgical history. No family history on file. History  Substance Use Topics  . Smoking status: Never Smoker   . Smokeless tobacco: Not on file  . Alcohol Use: Yes     Comment: occ    Review of Systems   Constitutional: Positive for appetite change and fatigue. Negative for fever, chills and diaphoresis.  HENT: Negative for congestion, rhinorrhea and sneezing.   Eyes: Negative.   Respiratory: Negative for cough, chest tightness and shortness of breath.   Cardiovascular: Negative for chest pain and leg swelling.  Gastrointestinal: Negative for nausea, vomiting, abdominal pain, diarrhea and blood in stool.  Genitourinary: Negative for frequency, hematuria, flank pain and difficulty urinating.  Musculoskeletal: Negative for arthralgias and back pain.  Skin: Negative for rash.  Neurological: Positive for dizziness and light-headedness. Negative for syncope, speech difficulty, weakness, numbness and headaches.      Allergies  Review of patient's allergies indicates no known allergies.  Home Medications   Current Outpatient Rx  Name  Route  Sig  Dispense  Refill  . atorvastatin (LIPITOR) 10 MG tablet   Oral   Take 10 mg by mouth daily.         . bisoprolol (ZEBETA) 5 MG tablet   Oral   Take 0.5 tablets (2.5 mg total) by mouth daily.   15 tablet   0   . furosemide (LASIX) 20 MG tablet   Oral   Take 20 mg by mouth.         . valsartan (DIOVAN) 320 MG tablet   Oral   Take 320 mg by mouth daily.         Marland Kitchen warfarin (COUMADIN) 5 MG tablet   Oral  Take 1 tablet (5 mg total) by mouth daily.          BP 128/79  Pulse 85  Temp(Src) 98.5 F (36.9 C) (Oral)  Resp 20  Ht 5\' 8"  (1.727 m)  Wt 195 lb (88.451 kg)  BMI 29.66 kg/m2  SpO2 91% Physical Exam  Constitutional: He is oriented to person, place, and time. He appears well-developed and well-nourished.  HENT:  Head: Normocephalic and atraumatic.  Eyes: EOM are normal. Pupils are equal, round, and reactive to light.  Neck: Normal range of motion. Neck supple.  Cardiovascular: Normal rate, regular rhythm and normal heart sounds.   Pulmonary/Chest: Effort normal and breath sounds normal. No respiratory distress. He  has no wheezes. He has no rales. He exhibits no tenderness.  Abdominal: Soft. Bowel sounds are normal. There is no tenderness. There is no rebound and no guarding.  Musculoskeletal: Normal range of motion. He exhibits no edema.  Lymphadenopathy:    He has no cervical adenopathy.  Neurological: He is alert and oriented to person, place, and time.  Motor 5 out of 5 all extremities. Sensation grossly intact to light touch all extremities. No pronator drift. Finger to nose intact. He has slight drooping of the right side of his face which was noted on a prior ED visit. He denies any new facial drooping. There is no numbness to his face on palpation. His speech is normal.  Skin: Skin is warm and dry. No rash noted.  Psychiatric: He has a normal mood and affect.    ED Course  Procedures (including critical care time) Labs Review Labs Reviewed  CBC WITH DIFFERENTIAL - Abnormal; Notable for the following:    WBC 206.1 (*)    RBC 2.91 (*)    Hemoglobin 9.0 (*)    HCT 27.2 (*)    RDW 21.0 (*)    Platelets 38 (*)    Neutrophils Relative % 2 (*)    Lymphocytes Relative 98 (*)    Monocytes Relative 0 (*)    Lymphs Abs 202.0 (*)    Monocytes Absolute 0.0 (*)    All other components within normal limits  COMPREHENSIVE METABOLIC PANEL - Abnormal; Notable for the following:    Potassium 3.5 (*)    Glucose, Bld 111 (*)    BUN 34 (*)    Creatinine, Ser 1.70 (*)    Albumin 3.0 (*)    AST 56 (*)    Alkaline Phosphatase 160 (*)    GFR calc non Af Amer 35 (*)    GFR calc Af Amer 41 (*)    All other components within normal limits  PROTIME-INR - Abnormal; Notable for the following:    Prothrombin Time 32.9 (*)    INR 3.37 (*)    All other components within normal limits  TROPONIN I  PATHOLOGIST SMEAR REVIEW  I-STAT CG4 LACTIC ACID, ED   Imaging Review Dg Chest 2 View  06/22/2013   CLINICAL DATA:  Dizziness and hypotension  EXAM: CHEST  2 VIEW  COMPARISON:  05/24/2013.  FINDINGS: The heart is  enlarged but stable. There is tortuosity, ectasia and calcification of the thoracic aorta. The lungs are clear of acute process. Density in the right mid lung appears to be due to a remote healed rib fracture. There are also remote thoracic spine compression fractures.  IMPRESSION: Cardiac enlargement but no acute pulmonary findings.   Electronically Signed   By: Kalman Jewels M.D.   On: 06/22/2013 17:02   Ct  Head Wo Contrast  06/22/2013   CLINICAL DATA:  80 year old male with dizziness, lightheadedness. Initial encounter. On Coumadin.  EXAM: CT HEAD WITHOUT CONTRAST  TECHNIQUE: Contiguous axial images were obtained from the base of the skull through the vertex without intravenous contrast.  COMPARISON:  01/09/2013.  FINDINGS: Minor paranasal sinus mucosal thickening is unchanged. Mastoids and tympanic cavities are clear. No acute osseous abnormality identified. No acute orbit or scalp soft tissue findings.  Calcified atherosclerosis at the skull base. Stable extra-axial CSF in both hemispheres and the posterior fossa. No acute intracranial hemorrhage identified. No midline shift, mass effect, or evidence of intracranial mass lesion. No ventriculomegaly. No suspicious intracranial vascular hyperdensity. Patchy bilateral periventricular hypodensity, and evidence of a chronic left corona radiata infarct tracking to the external capsule, unchanged. No evidence of cortically based acute infarction identified.  IMPRESSION: No acute intracranial abnormality. Stable cerebral volume loss and white matter changes that most resemble chronic small vessel ischemia.   Electronically Signed   By: Lars Pinks M.D.   On: 06/22/2013 17:01     EKG Interpretation None      MDM   Final diagnoses:  Leukemia    Patient presents with increased fatigue and dizziness. He has evidence of acute leukemia. And not finding any evidence of infection. He was hydrated with IV fluids. I will consult the hospitalist for  admission.  I did discuss with [facility] hospitalist to request I talk with the hematologist on call. I discussed the patient with Dr. Beryle Beams. Given that the patient had a normal white blood cell count in September and now is markedly elevated, he likely has ALT L. Given this Dr. Beryle Beams felt that he be better served at [facility]. I consult and with the hospitalist there he was accepted patient for transfer.  Malvin Johns, MD 06/22/13 [id number]  Malvin Johns, MD 06/22/13 [id number]

## 2013-06-23 LAB — PATHOLOGIST SMEAR REVIEW

## 2014-09-21 DEATH — deceased

## 2015-11-02 IMAGING — CT CT HEAD W/O CM
1 series · 15 of 30 positions shown, 19 images · non-contrast
Comparison: 01/09/2013.

CLINICAL DATA: 87-year-old male with dizziness, lightheadedness.
Initial encounter. On Coumadin.

EXAM:
CT HEAD WITHOUT CONTRAST
TECHNIQUE: Contiguous axial images were obtained from the base of the skull
through the vertex without intravenous contrast.

[Series 2: head 4.8 h37s · axial · 0.44mm/px · z∈[-127,+33]mm · 15 of 36 slices shown, 19 images]
[im 2/36  brain]
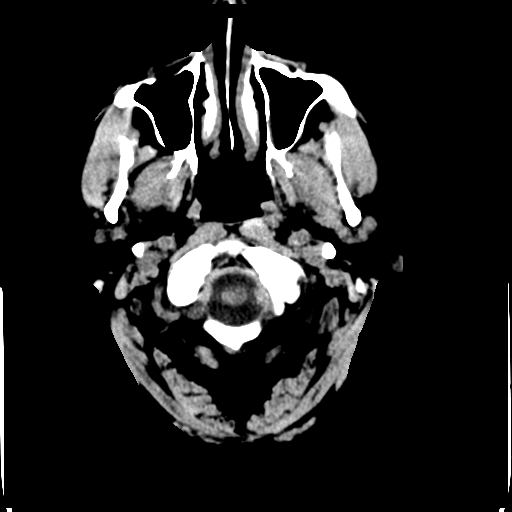
[im 2/36  bone]
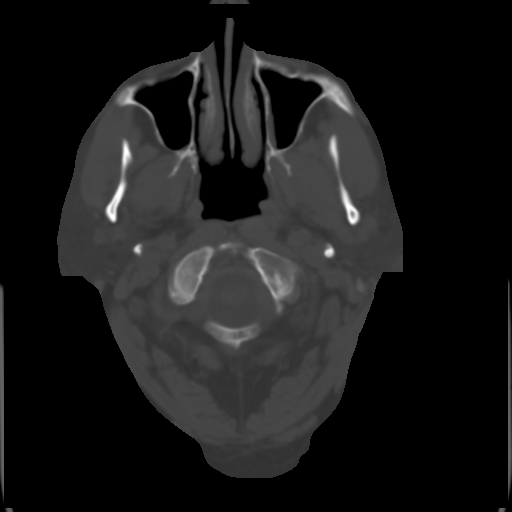
[im 4/36  brain]
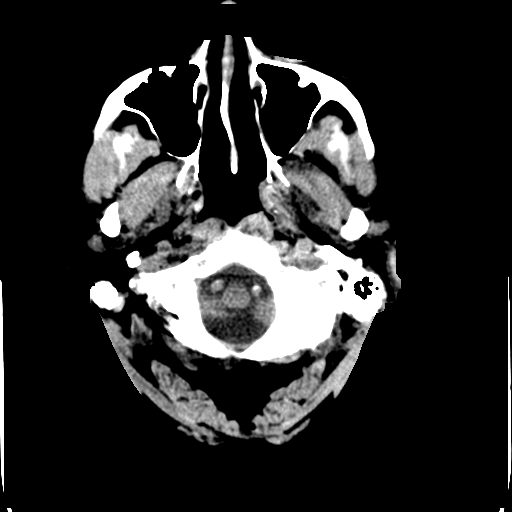
[im 7/36  brain]
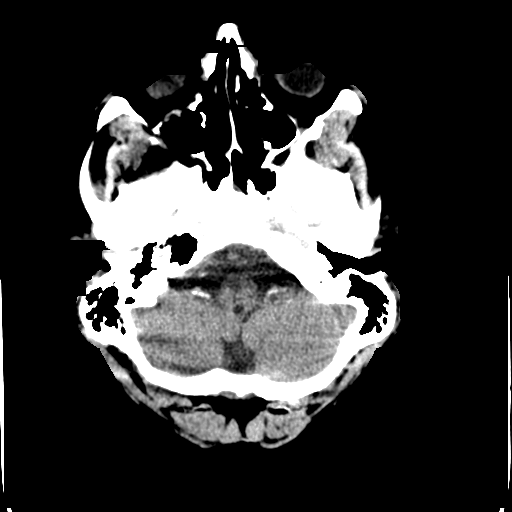
[im 9/36  brain]
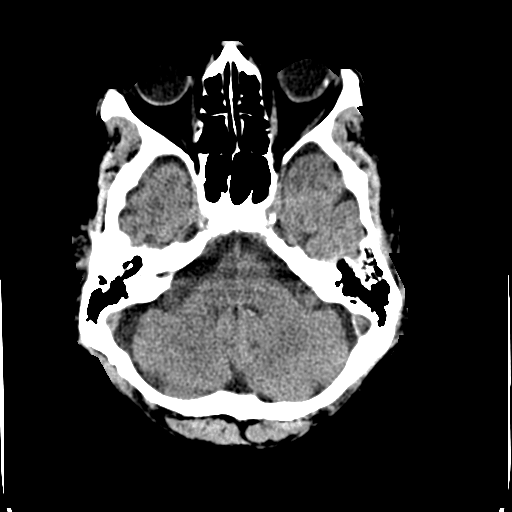
[im 11/36  brain]
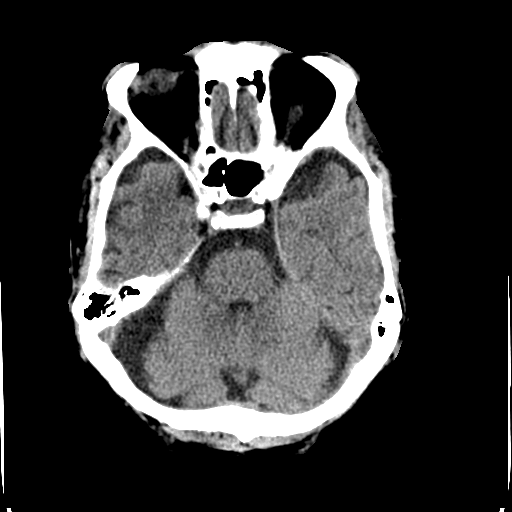
[im 11/36  bone]
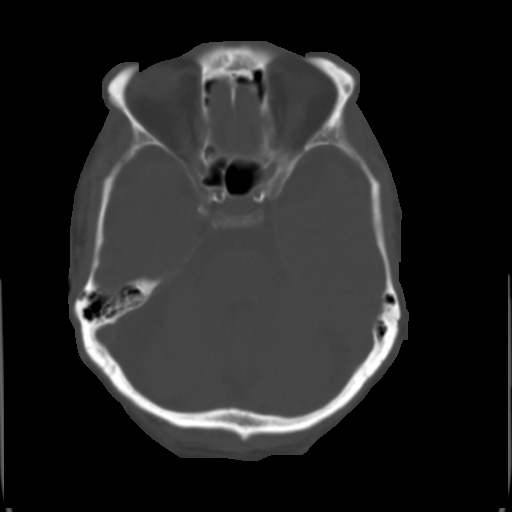
[im 14/36  brain]
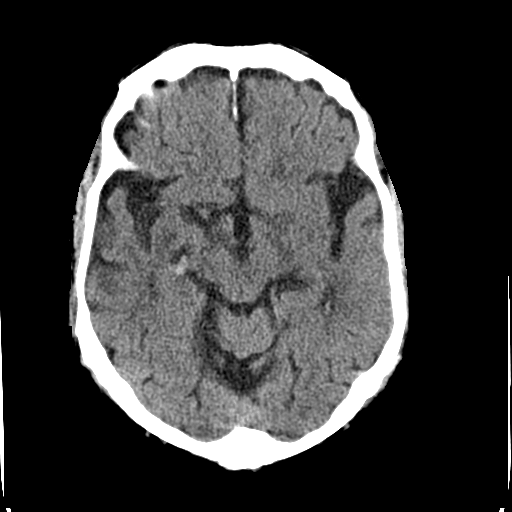
[im 16/36  brain]
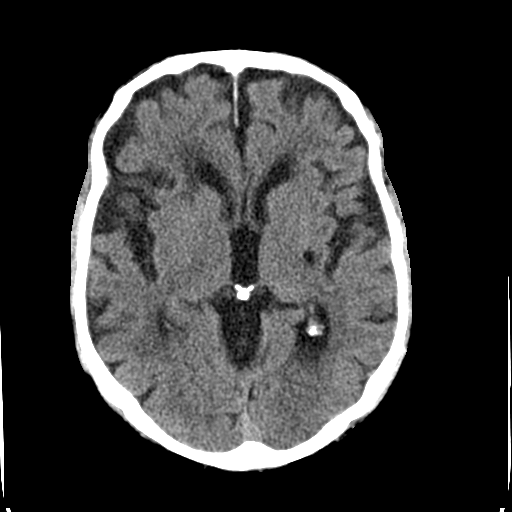
[im 19/36  brain]
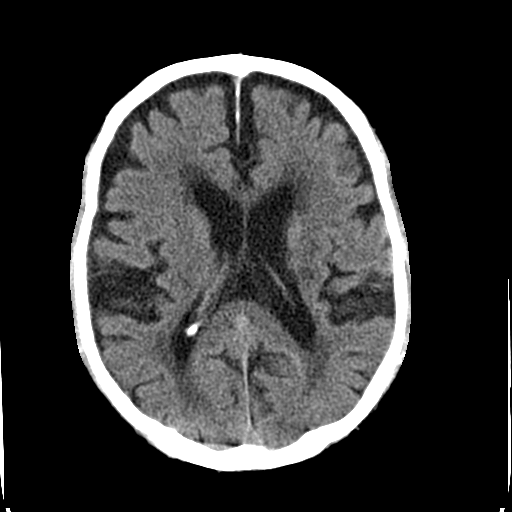
[im 20/36  brain]
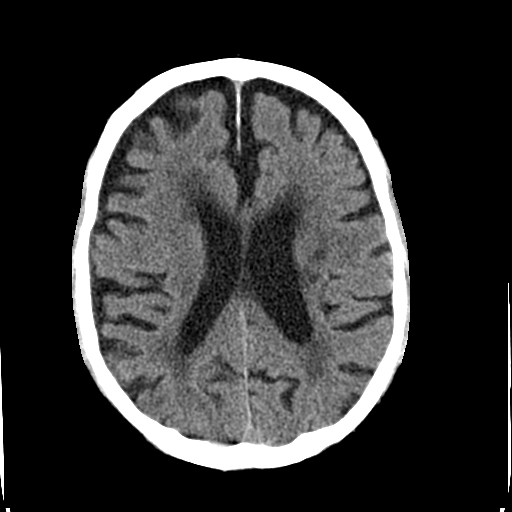
[im 20/36  bone]
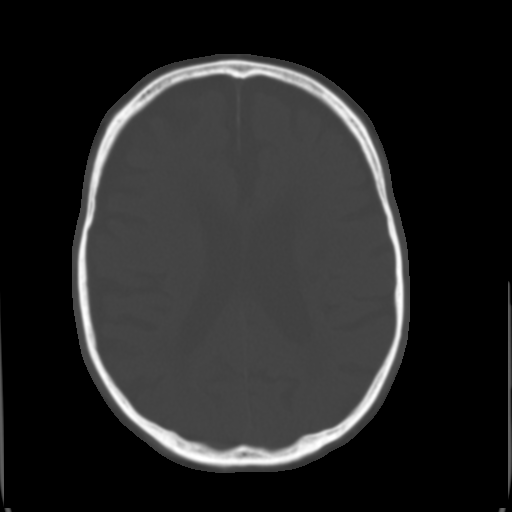
[im 22/36  brain]
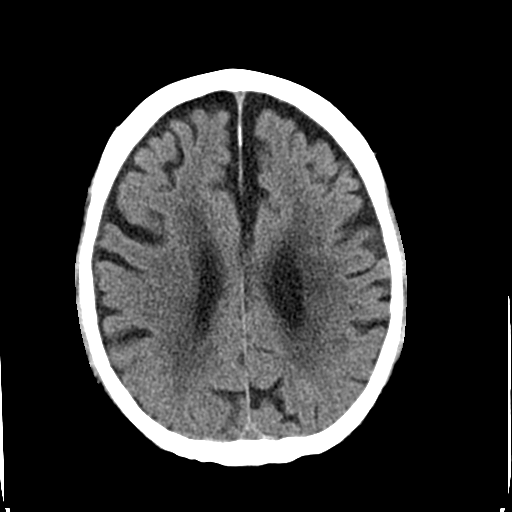
[im 25/36  brain]
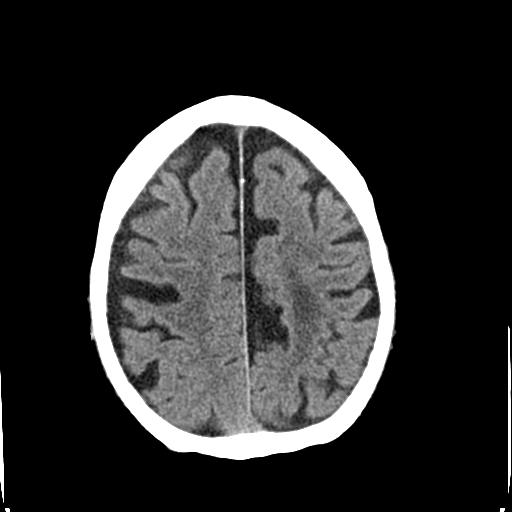
[im 27/36  brain]
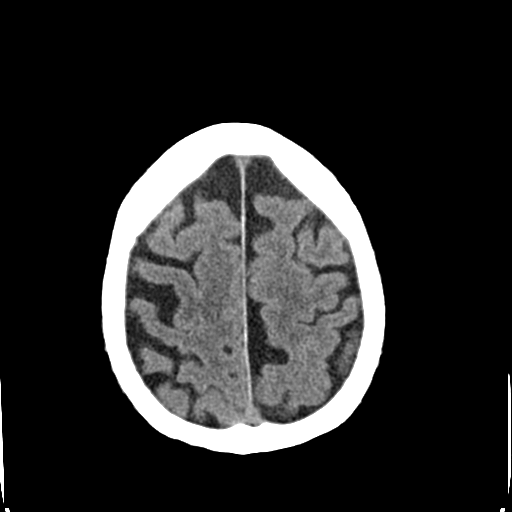
[im 29/36  brain]
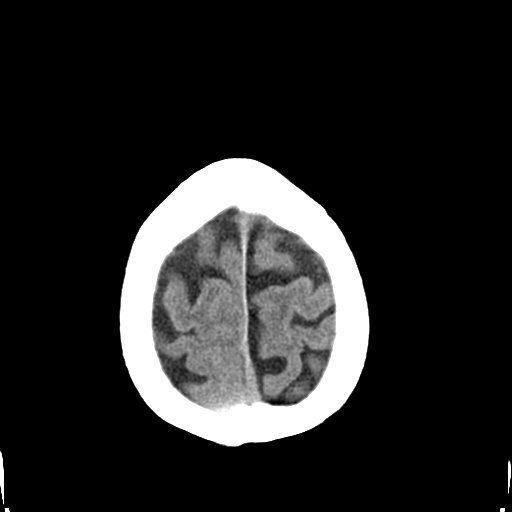
[im 29/36  bone]
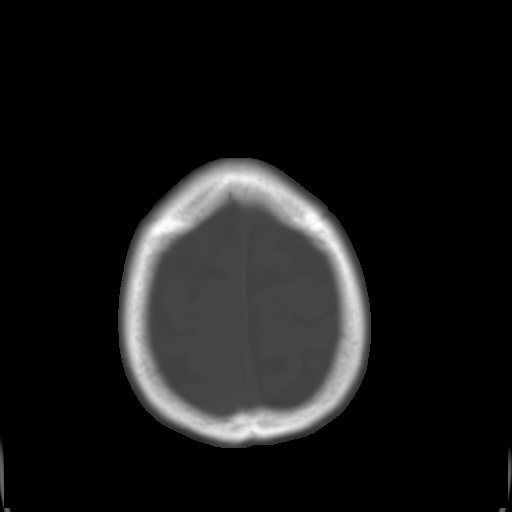
[im 32/36  brain]
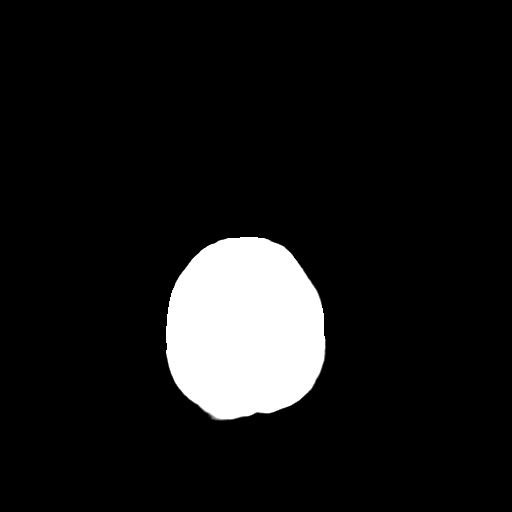
[im 34/36  brain]
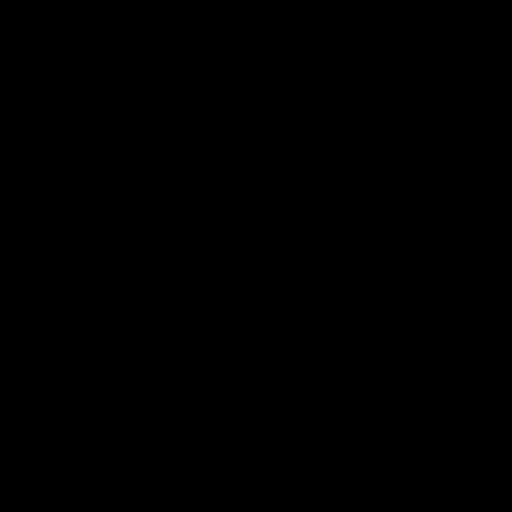

[15 of 30 positions shown; findings below may reference images not displayed]

FINDINGS: Minor paranasal sinus mucosal thickening is unchanged. Mastoids and
tympanic cavities are clear. No acute osseous abnormality
identified. No acute orbit or scalp soft tissue findings.

Calcified atherosclerosis at the skull base. Stable extra-axial CSF
in both hemispheres and the posterior fossa. No acute intracranial
hemorrhage identified. No midline shift, mass effect, or evidence of
intracranial mass lesion. No ventriculomegaly. No suspicious
intracranial vascular hyperdensity. Patchy bilateral periventricular
hypodensity, and evidence of a chronic left corona radiata infarct
tracking to the external capsule, unchanged. No evidence of
cortically based acute infarction identified.
IMPRESSION: No acute intracranial abnormality. Stable cerebral volume loss and
white matter changes that most resemble chronic small vessel
ischemia.
